# Patient Record
Sex: Female | Born: 1955 | Race: White | Hispanic: No | Marital: Single | Smoking: Current every day smoker
Health system: Southern US, Community
[De-identification: ages and names within clinical notes are randomized; demographics above are authoritative.]

## PROBLEM LIST (undated history)

## (undated) DIAGNOSIS — Z90710 Acquired absence of both cervix and uterus: Secondary | ICD-10-CM

## (undated) HISTORY — DX: Acquired absence of both cervix and uterus: Z90.710

## (undated) HISTORY — PX: TOTAL ABDOMINAL HYSTERECTOMY: SHX209

---

## 2005-03-04 ENCOUNTER — Emergency Department: Payer: Self-pay | Admitting: Emergency Medicine

## 2006-08-18 ENCOUNTER — Other Ambulatory Visit: Payer: Self-pay

## 2006-08-19 ENCOUNTER — Inpatient Hospital Stay: Payer: Self-pay | Admitting: Internal Medicine

## 2006-08-19 ENCOUNTER — Inpatient Hospital Stay: Payer: Self-pay | Admitting: Psychiatry

## 2008-10-29 ENCOUNTER — Emergency Department: Payer: Self-pay | Admitting: Emergency Medicine

## 2009-04-12 ENCOUNTER — Emergency Department (HOSPITAL_COMMUNITY): Admission: EM | Admit: 2009-04-12 | Discharge: 2009-04-12 | Payer: Self-pay | Admitting: Emergency Medicine

## 2009-12-25 ENCOUNTER — Emergency Department: Payer: Self-pay | Admitting: Emergency Medicine

## 2011-01-11 ENCOUNTER — Ambulatory Visit: Payer: Self-pay

## 2012-03-07 ENCOUNTER — Ambulatory Visit: Payer: Self-pay | Admitting: Family Medicine

## 2013-08-25 ENCOUNTER — Ambulatory Visit: Payer: Self-pay

## 2014-04-30 ENCOUNTER — Emergency Department: Payer: Self-pay | Admitting: Emergency Medicine

## 2014-04-30 LAB — URINALYSIS, COMPLETE
BACTERIA: NEGATIVE
Bilirubin,UR: NEGATIVE
Glucose,UR: NEGATIVE mg/dL (ref 0–75)
KETONE: NEGATIVE
Leukocyte Esterase: NEGATIVE
Nitrite: NEGATIVE
PROTEIN: NEGATIVE
Ph: 5 (ref 4.5–8.0)
Specific Gravity: 1.015 (ref 1.003–1.030)

## 2014-04-30 LAB — CBC
HCT: 43 % (ref 35.0–47.0)
HGB: 14.5 g/dL (ref 12.0–16.0)
MCH: 29.2 pg (ref 26.0–34.0)
MCHC: 33.6 g/dL (ref 32.0–36.0)
MCV: 87 fL (ref 80–100)
Platelet: 170 10*3/uL (ref 150–440)
RBC: 4.95 10*6/uL (ref 3.80–5.20)
RDW: 13.7 % (ref 11.5–14.5)
WBC: 7.5 10*3/uL (ref 3.6–11.0)

## 2014-04-30 LAB — COMPREHENSIVE METABOLIC PANEL
ALK PHOS: 97 U/L
AST: 25 U/L (ref 15–37)
Albumin: 3.6 g/dL (ref 3.4–5.0)
Anion Gap: 11 (ref 7–16)
BILIRUBIN TOTAL: 0.2 mg/dL (ref 0.2–1.0)
BUN: 19 mg/dL — AB (ref 7–18)
CO2: 17 mmol/L — AB (ref 21–32)
CREATININE: 0.93 mg/dL (ref 0.60–1.30)
Calcium, Total: 8.1 mg/dL — ABNORMAL LOW (ref 8.5–10.1)
Chloride: 109 mmol/L — ABNORMAL HIGH (ref 98–107)
EGFR (African American): >60
EGFR (Non-African Amer.): >60
GLUCOSE: 97 mg/dL (ref 65–99)
Osmolality: 276 (ref 275–301)
Potassium: 3.6 mmol/L (ref 3.5–5.1)
SGPT (ALT): 25 U/L (ref 12–78)
SODIUM: 137 mmol/L (ref 136–145)
TOTAL PROTEIN: 6.9 g/dL (ref 6.4–8.2)

## 2014-04-30 LAB — ETHANOL
ETHANOL %: 0.188 % — AB (ref 0.000–0.080)
Ethanol: 188 mg/dL

## 2014-04-30 LAB — ACETAMINOPHEN LEVEL

## 2014-04-30 LAB — SALICYLATE LEVEL: Salicylates, Serum: 3.6 mg/dL — ABNORMAL HIGH

## 2014-05-01 LAB — DRUG SCREEN, URINE
AMPHETAMINES, UR SCREEN: NEGATIVE (ref ?–1000)
Barbiturates, Ur Screen: NEGATIVE (ref ?–200)
Benzodiazepine, Ur Scrn: POSITIVE (ref ?–200)
COCAINE METABOLITE, UR ~~LOC~~: NEGATIVE (ref ?–300)
Cannabinoid 50 Ng, Ur ~~LOC~~: NEGATIVE (ref ?–50)
MDMA (ECSTASY) UR SCREEN: POSITIVE (ref ?–500)
Methadone, Ur Screen: NEGATIVE (ref ?–300)
OPIATE, UR SCREEN: POSITIVE (ref ?–300)
PHENCYCLIDINE (PCP) UR S: NEGATIVE (ref ?–25)
TRICYCLIC, UR SCREEN: NEGATIVE (ref ?–1000)

## 2014-05-01 LAB — VALPROIC ACID LEVEL

## 2015-03-06 NOTE — Consult Note (Signed)
Brief Consult Note: Diagnosis: Alcohol intoxicatiom. alcohol abuse.   Patient was seen by consultant.   Consult note dictated.   Recommend further assessment or treatment.   Comments: Ms. Siefken has remote h/o depresTeena Dunksion. She got drunk last night and made suicidal threats. She is not suicidal or homicidal.  PLAN: 1. The patient no longer meet criteria for IVC. I will terminate proceedings. Please discharge as appropriate.   2. No medications recommended.  3. She is not interested in substance abuse treatment.  Electronic Signatures: Kristine LineaPucilowska, Jolanta (MD)  (Signed 19-Jun-15 12:32)  Authored: Brief Consult Note   Last Updated: 19-Jun-15 12:32 by Kristine LineaPucilowska, Jolanta (MD)
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
## 2015-11-29 ENCOUNTER — Other Ambulatory Visit: Payer: Self-pay | Admitting: *Deleted

## 2015-11-29 ENCOUNTER — Ambulatory Visit: Payer: Self-pay | Attending: Oncology | Admitting: *Deleted

## 2015-11-29 ENCOUNTER — Encounter: Payer: Self-pay | Admitting: *Deleted

## 2015-11-29 ENCOUNTER — Ambulatory Visit
Admission: RE | Admit: 2015-11-29 | Discharge: 2015-11-29 | Disposition: A | Discharge status: Home / Self Care | Payer: Self-pay | Source: Ambulatory Visit | Attending: Oncology | Admitting: Oncology

## 2015-11-29 VITALS — BP 164/88 | HR 65 | Temp 97.9°F | Ht 64.17 in | Wt 149.9 lb

## 2015-11-29 DIAGNOSIS — Z0000 Encounter for general adult medical examination without abnormal findings: Secondary | ICD-10-CM

## 2015-11-29 NOTE — Progress Notes (Signed)
Subjective:     Patient ID: Garth Bignessorie L Hunsucker, female   DOB: 06/01/1956, 60 y.o.   MRN: [id number]  HPI   Review of Systems     Objective:   Physical Exam  Pulmonary/Chest: Right breast exhibits no inverted nipple, no mass, no nipple discharge, no skin change and no tenderness. Left breast exhibits no inverted nipple, no mass, no nipple discharge, no skin change and no tenderness. Breasts are symmetrical.       Assessment:     60 year old White female [facility]  Clinical breast exam unremarkable. Taught self breast awareness.  Patient mentioned she would have a "blister like" lesion come up and then it would turn into a "dry spot" on her skin.  There is a cm like dry area in the upper right chest area.  Encouraged a dermatology consult.  States she can [facility].  Blood pressure elevated at 164/88.  She is to recheck her blood pressure at [facility] or [facility], and if remains higher than 140/90 she is to follow-up with her primary care provider.  States her primary care provider has just readjusted her meds, and she also is seeing a hematologist this Friday in [place] for elevated Hgb.   Hand out on hypertention given to patient. Also encouraged smoking cessation. Patient has been screened for eligibility.  She does not have any insurance, Medicare or Medicaid.  She also meets financial eligibility.  Hand-out given on the Affordable Care Act.    Plan:     Screening mammogram ordered.  Will follow up per [facility] protocol.

## 2015-11-29 NOTE — Patient Instructions (Addendum)
Smoking Cessation, Tips for Success If you are ready to quit smoking, congratulations! You have chosen to help yourself be healthier. Cigarettes bring nicotine, tar, carbon monoxide, and other irritants into your body. Your lungs, heart, and blood vessels will be able to work better without these poisons. There are many different ways to quit smoking. Nicotine gum, nicotine patches, a nicotine inhaler, or nicotine nasal spray can help with physical craving. Hypnosis, support groups, and medicines help break the habit of smoking. WHAT THINGS CAN I DO TO MAKE QUITTING EASIER?  Here are some tips to help you quit for good: Pick a date when you will quit smoking completely. Tell all of your friends and family about your plan to quit on that date. Do not try to slowly cut down on the number of cigarettes you are smoking. Pick a quit date and quit smoking completely starting on that day. Throw away all cigarettes.  Clean and remove all ashtrays from your home, work, and car. On a card, write down your reasons for quitting. Carry the card with you and read it when you get the urge to smoke. Cleanse your body of nicotine. Drink enough water and fluids to keep your urine clear or pale yellow. Do this after quitting to flush the nicotine from your body. Learn to predict your moods. Do not let a bad situation be your excuse to have a cigarette. Some situations in your life might tempt you into wanting a cigarette. Never have "just one" cigarette. It leads to wanting another and another. Remind yourself of your decision to quit. Change habits associated with smoking. If you smoked while driving or when feeling stressed, try other activities to replace smoking. Stand up when drinking your coffee. Brush your teeth after eating. Sit in a different chair when you read the paper. Avoid alcohol while trying to quit, and try to drink fewer caffeinated beverages. Alcohol and caffeine may urge you to smoke. Avoid foods and  drinks that can trigger a desire to smoke, such as sugary or spicy foods and alcohol. Ask people who smoke not to smoke around you. Have something planned to do right after eating or having a cup of coffee. For example, plan to take a walk or exercise. Try a relaxation exercise to calm you down and decrease your stress. Remember, you may be tense and nervous for the first 2 weeks after you quit, but this will pass. Find new activities to keep your hands busy. Play with a pen, coin, or rubber band. Doodle or draw things on paper. Brush your teeth right after eating. This will help cut down on the craving for the taste of tobacco after meals. You can also try mouthwash.  Use oral substitutes in place of cigarettes. Try using lemon drops, carrots, cinnamon sticks, or chewing gum. Keep them handy so they are available when you have the urge to smoke. When you have the urge to smoke, try deep breathing. Designate your home as a nonsmoking area. If you are a heavy smoker, ask your health care provider about a prescription for nicotine chewing gum. It can ease your withdrawal from nicotine. Reward yourself. Set aside the cigarette money you save and buy yourself something nice. Look for support from others. Join a support group or smoking cessation program. Ask someone at home or at work to help you with your plan to quit smoking. Always ask yourself, "Do I need this cigarette or is this just a reflex?" Tell yourself, "Today, I  choose not to smoke," or "I do not want to smoke." You are reminding yourself of your decision to quit. Do not replace cigarette smoking with electronic cigarettes (commonly called e-cigarettes). The safety of e-cigarettes is unknown, and some may contain harmful chemicals. If you relapse, do not give up! Plan ahead and think about what you will do the next time you get the urge to smoke. HOW WILL I FEEL WHEN I QUIT SMOKING? You may have symptoms of withdrawal because your body is  used to nicotine (the addictive substance in cigarettes). You may crave cigarettes, be irritable, feel very hungry, cough often, get headaches, or have difficulty concentrating. The withdrawal symptoms are only temporary. They are strongest when you first quit but will go away within 10-14 days. When withdrawal symptoms occur, stay in control. Think about your reasons for quitting. Remind yourself that these are signs that your body is healing and getting used to being without cigarettes. Remember that withdrawal symptoms are easier to treat than the major diseases that smoking can cause.  Even after the withdrawal is over, expect periodic urges to smoke. However, these cravings are generally short lived and will go away whether you smoke or not. Do not smoke! WHAT RESOURCES ARE AVAILABLE TO HELP ME QUIT SMOKING? Your health care provider can direct you to community resources or hospitals for support, which may include: Group support. Education. Hypnosis. Therapy.   This information is not intended to replace advice given to you by your health care provider. Make sure you discuss any questions you have with your health care provider.   Document Released: 07/28/2004 Document Revised: 11/20/2014 Document Reviewed: 04/17/2013 Elsevier Interactive Patient Education 2016 [employer]. Hypertension Hypertension, commonly called high blood pressure, is when the force of blood pumping through your arteries is too strong. Your arteries are the blood vessels that carry blood from your heart throughout your body. A blood pressure reading consists of a higher number over a lower number, such as 110/72. The higher number (systolic) is the pressure inside your arteries when your heart pumps. The lower number (diastolic) is the pressure inside your arteries when your heart relaxes. Ideally you want your blood pressure below 120/80. Hypertension forces your heart to work harder to pump blood. Your arteries may  become narrow or stiff. Having untreated or uncontrolled hypertension can cause heart attack, stroke, kidney disease, and other problems. RISK FACTORS Some risk factors for high blood pressure are controllable. Others are not.  Risk factors you cannot control include:   Race. You may be at higher risk if you are African American.  Age. Risk increases with age.  Gender. Men are at higher risk than women before age 71 years. After age 80, women are at higher risk than men. Risk factors you can control include:  Not getting enough exercise or physical activity.  Being overweight.  Getting too much fat, sugar, calories, or salt in your diet.  Drinking too much alcohol. SIGNS AND SYMPTOMS Hypertension does not usually cause signs or symptoms. Extremely high blood pressure (hypertensive crisis) may cause headache, anxiety, shortness of breath, and nosebleed. DIAGNOSIS To check if you have hypertension, your health care provider will measure your blood pressure while you are seated, with your arm held at the level of your heart. It should be measured at least twice using the same arm. Certain conditions can cause a difference in blood pressure between your right and left arms. A blood pressure reading that is higher than normal on  one occasion does not mean that you need treatment. If it is not clear whether you have high blood pressure, you may be asked to return on a different day to have your blood pressure checked again. Or, you may be asked to monitor your blood pressure at home for 1 or more weeks. TREATMENT Treating high blood pressure includes making lifestyle changes and possibly taking medicine. Living a healthy lifestyle can help lower high blood pressure. You may need to change some of your habits. Lifestyle changes may include:  Following the DASH diet. This diet is high in fruits, vegetables, and whole grains. It is low in salt, red meat, and added sugars.  Keep your sodium intake  below 2,300 mg per day.  Getting at least 30-45 minutes of aerobic exercise at least 4 times per week.  Losing weight if necessary.  Not smoking.  Limiting alcoholic beverages.  Learning ways to reduce stress. Your health care provider may prescribe medicine if lifestyle changes are not enough to get your blood pressure under control, and if one of the following is true:  You are 1118-58 years of age and your systolic blood pressure is above 140.  You are 58 years of age or older, and your systolic blood pressure is above 150.  Your diastolic blood pressure is above 90.  You have diabetes, and your systolic blood pressure is over 140 or your diastolic blood pressure is over 90.  You have kidney disease and your blood pressure is above 140/90.  You have heart disease and your blood pressure is above 140/90. Your personal target blood pressure may vary depending on your medical conditions, your age, and other factors. HOME CARE INSTRUCTIONS  Have your blood pressure rechecked as directed by your health care provider.   Take medicines only as directed by your health care provider. Follow the directions carefully. Blood pressure medicines must be taken as prescribed. The medicine does not work as well when you skip doses. Skipping doses also puts you at risk for problems.  Do not smoke.   Monitor your blood pressure at home as directed by your health care provider. SEEK MEDICAL CARE IF:   You think you are having a reaction to medicines taken.  You have recurrent headaches or feel dizzy.  You have swelling in your ankles.  You have trouble with your vision. SEEK IMMEDIATE MEDICAL CARE IF:  You develop a severe headache or confusion.  You have unusual weakness, numbness, or feel faint.  You have severe chest or abdominal pain.  You vomit repeatedly.  You have trouble breathing. MAKE SURE YOU:   Understand these instructions.  Will watch your condition.  Will  get help right away if you are not doing well or get worse.   This information is not intended to replace advice given to you by your health care provider. Make sure you discuss any questions you have with your health care provider.   Document Released: 10/30/2005 Document Revised: 03/16/2015 Document Reviewed: 08/22/2013 Elsevier Interactive Patient Education 2016 [employer]   Gave patient hand-out, Women Staying Healthy, Active and Well from [facility], with education on breast health, pap smears, heart and colon health.

## 2015-11-29 NOTE — Progress Notes (Signed)
Letter mailed from [facility] to inform patient of her normal mammogram results.  Patient is to follow-up with annual screening in one year.  HSIS to Christy. 

## 2015-12-01 DIAGNOSIS — D61818 Other pancytopenia: Secondary | ICD-10-CM | POA: Insufficient documentation

## 2019-06-09 ENCOUNTER — Encounter: Payer: Self-pay | Admitting: Emergency Medicine

## 2019-06-09 ENCOUNTER — Emergency Department
Admission: EM | Admit: 2019-06-09 | Discharge: 2019-06-09 | Disposition: A | Discharge status: Home / Self Care | Payer: Self-pay | Attending: Emergency Medicine | Admitting: Emergency Medicine

## 2019-06-09 ENCOUNTER — Other Ambulatory Visit: Payer: Self-pay

## 2019-06-09 DIAGNOSIS — L03116 Cellulitis of left lower limb: Secondary | ICD-10-CM | POA: Insufficient documentation

## 2019-06-09 DIAGNOSIS — Y929 Unspecified place or not applicable: Secondary | ICD-10-CM | POA: Insufficient documentation

## 2019-06-09 DIAGNOSIS — F17200 Nicotine dependence, unspecified, uncomplicated: Secondary | ICD-10-CM | POA: Insufficient documentation

## 2019-06-09 DIAGNOSIS — L089 Local infection of the skin and subcutaneous tissue, unspecified: Secondary | ICD-10-CM | POA: Insufficient documentation

## 2019-06-09 DIAGNOSIS — B373 Candidiasis of vulva and vagina: Secondary | ICD-10-CM | POA: Insufficient documentation

## 2019-06-09 DIAGNOSIS — Y939 Activity, unspecified: Secondary | ICD-10-CM | POA: Insufficient documentation

## 2019-06-09 DIAGNOSIS — T63441A Toxic effect of venom of bees, accidental (unintentional), initial encounter: Secondary | ICD-10-CM | POA: Insufficient documentation

## 2019-06-09 DIAGNOSIS — X58XXXA Exposure to other specified factors, initial encounter: Secondary | ICD-10-CM | POA: Insufficient documentation

## 2019-06-09 DIAGNOSIS — S90829A Blister (nonthermal), unspecified foot, initial encounter: Secondary | ICD-10-CM | POA: Insufficient documentation

## 2019-06-09 DIAGNOSIS — Y999 Unspecified external cause status: Secondary | ICD-10-CM | POA: Insufficient documentation

## 2019-06-09 MED ORDER — TRAMADOL HCL 50 MG PO TABS
50.0000 mg | ORAL_TABLET | Freq: Once | ORAL | Status: AC
  Administered 2019-06-09: 22:00:00 50 mg via ORAL
  Filled 2019-06-09: qty 1

## 2019-06-09 MED ORDER — CEPHALEXIN 500 MG PO CAPS
500.0000 mg | ORAL_CAPSULE | Freq: Once | ORAL | Status: AC
  Administered 2019-06-09: 21:00:00 500 mg via ORAL
  Filled 2019-06-09: qty 1

## 2019-06-09 MED ORDER — FLUCONAZOLE 150 MG PO TABS: 150.0000 mg | ORAL_TABLET | Freq: Once | ORAL | 0 refills | Status: AC

## 2019-06-09 MED ORDER — MUPIROCIN CALCIUM 2 % EX CREA
TOPICAL_CREAM | Freq: Once | CUTANEOUS | Status: AC
  Administered 2019-06-09: 1 via TOPICAL
  Filled 2019-06-09: qty 15

## 2019-06-09 MED ORDER — ACETAMINOPHEN-CODEINE #3 300-30 MG PO TABS: 1.0000 | ORAL_TABLET | Freq: Three times a day (TID) | ORAL | 0 refills | Status: AC | PRN

## 2019-06-09 MED ORDER — CEPHALEXIN 500 MG PO CAPS: 500.0000 mg | ORAL_CAPSULE | Freq: Three times a day (TID) | ORAL | 0 refills | Status: AC

## 2019-06-09 MED ORDER — MUPIROCIN 2 % EX OINT: TOPICAL_OINTMENT | CUTANEOUS | 0 refills | Status: AC

## 2019-06-09 NOTE — ED Triage Notes (Signed)
Pt reports she was bite by yellow jacket on left ankle on Friday and wore tennis shoes over the weekend. Today pt has had multiple blisters around area of sting as well as swelling and clear drainage. Area is swollen, bruised and clear drainage noted. Pt denies fever.

## 2019-06-09 NOTE — Discharge Instructions (Addendum)
Keep the wound clean, dry, and covered. Change dressings daily. Rest with the foot elevated when seated.

## 2019-06-10 NOTE — ED Provider Notes (Signed)
[facility] Emergency Department Provider Note ____________________________________________  Time seen: 2032  I have reviewed the triage vital signs and the nursing notes.  HISTORY  Chief Complaint  Blister  HPI Krista Spencer is a 63 y.o. female presents herself to the ED for evaluation of a foot wound and blisters to the left heel.  Patient admits to being stung on the ankle by a yellow jacket on Friday.  She reports that over the weekend, she wore tennis shoes and spent and a large amount of time on her feet helping her son to move.  By the time she looked at her foot, she has significant swelling and edema to the foot and ankle as well as blister formation to the Achilles and heel region.  She presents today for further evaluation of her symptoms patient denies any interim fevers, chills, or sweats.  She does admit to busting the initial large intact blister, and noticing some clear drainage from the area.  History reviewed. No pertinent past medical history.  There are no active problems to display for this patient.  History reviewed. No pertinent surgical history.  Prior to Admission medications   Medication Sig Start Date End Date Taking? Authorizing Provider  acetaminophen-codeine (TYLENOL #3) 300-30 MG tablet Take 1 tablet by mouth 3 (three) times daily as needed for up to 3 days for moderate pain. 06/09/19 06/12/19  Elleen Coulibaly, Dannielle Karvonen, PA-C  cephALEXin (KEFLEX) 500 MG capsule Take 1 capsule (500 mg total) by mouth 3 (three) times daily. 06/09/19   Vint Pola, Dannielle Karvonen, PA-C  mupirocin ointment (BACTROBAN) 2 % Apply to affected area 2 times daily 06/09/19   Iolani Twilley, Dannielle Karvonen, PA-C    Allergies Patient has no known allergies.  History reviewed. No pertinent family history.  Social History Social History   Tobacco Use  . Smoking status: Current Every Day Smoker  . Smokeless tobacco: Never Used  Substance Use Topics  . Alcohol use: Not on  file  . Drug use: Not on file    Review of Systems  Constitutional: Negative for fever. Eyes: Negative for visual changes. ENT: Negative for sore throat. Cardiovascular: Negative for chest pain. Respiratory: Negative for shortness of breath. Gastrointestinal: Negative for abdominal pain, vomiting and diarrhea. Genitourinary: Negative for dysuria. Musculoskeletal: Negative for back pain.  Left leg swelling as above. Skin: Negative for rash.  Left foot blisters as above. Neurological: Negative for headaches, focal weakness or numbness. ____________________________________________  PHYSICAL EXAM:  VITAL SIGNS: ED Triage Vitals  Enc Vitals Group     BP 06/09/19 1914 (!) 159/83     Pulse Rate 06/09/19 1914 92     Resp 06/09/19 1914 17     Temp 06/09/19 1914 98.9 F (37.2 C)     Temp Source 06/09/19 1914 Oral     SpO2 06/09/19 1914 98 %     Weight --      Height --      Head Circumference --      Peak Flow --      Pain Score 06/09/19 2155 7     Pain Loc --      Pain Edu? --      Excl. in [place]? --     Constitutional: Alert and oriented. Well appearing and in no distress. Head: Normocephalic and atraumatic. Eyes: Conjunctivae are normal. Normal extraocular movements Cardiovascular: Normal rate, regular rhythm. Normal distal pulses.  Left lower extremity edema from the mid shin to the dorsal  foot. Respiratory: Normal respiratory effort.  Musculoskeletal: Nontender with normal range of motion in all extremities.  Neurologic:  Normal gait without ataxia. Normal speech and language. No gross focal neurologic deficits are appreciated. Skin:  Skin is warm, dry and intact. No rash noted.  Patient with intact blisters to the dorsal and lateral aspect of the foot and ankle.  Patient with a large blister bed with focal ecchymosis and some honey colored crust noted.  There is also an area of ecchymosis consistent with a bee sting.  No lymphangitis, induration, or abscess formation is  appreciated. ____________________________________________  PROCEDURES  Procedures Keflex 500 mg PO Ultram 50 mg PO Mupirocin cream Dressing ____________________________________________  INITIAL IMPRESSION / ASSESSMENT AND PLAN / ED COURSE  Krista Spencer was evaluated in Emergency Department on 06/10/2019 for the symptoms described in the history of present illness. She was evaluated in the context of the global COVID-19 pandemic, which necessitated consideration that the patient might be at risk for infection with the SARS-CoV-2 virus that causes COVID-19. Institutional protocols and algorithms that pertain to the evaluation of patients at risk for COVID-19 are in a state of rapid change based on information released by regulatory bodies including the CDC and federal and state organizations. These policies and algorithms were followed during the patient's care in the ED.  Patient with ED evaluation of a local reaction to a bee sting, as well as local blister formation after she continued to be on her feet following the initial sting.  Patient without any signs of a focal abscess.  Symptoms likely represent a secondary staph infection after she opened an intact friction blister to the foot and ankle.  Patient will be treated empirically with Keflex and topical mupirocin ointment.  She is also given a Diflucan for yeast vaginitis.  A small prescription of Tylenol #3 will provided for pain relief.  She is required to rest with the foot elevated and change the dressing daily as needed.  She is to keep the remaining blisters intact, and cleanse the area daily with mild soap and water.  Wound care instructions and supplies are provided.  Patient will follow-up with a primary provider or return to the ED for acutely worsening symptoms as discussed.  I reviewed the patient's prescription history over the last 12 months in the multi-state controlled substances database(s) that includes [place], [place], [place], [place], [place], [place], [place], [place], [place], [place], [place], [place], and [place].  Results were notable for no current prescriptions.  ____________________________________________  FINAL CLINICAL IMPRESSION(S) / ED DIAGNOSES  Final diagnoses:  Cellulitis of left lower extremity  Blister of foot with infection, initial encounter  Bee sting reaction, accidental or unintentional, initial encounter      Lissa HoardMenshew, Anival Pasha V Bacon, PA-C 06/10/19 2330    Phineas SemenGoodman, [place], MD 06/11/19 1714

## 2022-03-20 ENCOUNTER — Other Ambulatory Visit: Payer: Self-pay

## 2022-03-20 DIAGNOSIS — N6310 Unspecified lump in the right breast, unspecified quadrant: Secondary | ICD-10-CM

## 2022-03-20 DIAGNOSIS — Z1211 Encounter for screening for malignant neoplasm of colon: Secondary | ICD-10-CM

## 2022-03-21 ENCOUNTER — Other Ambulatory Visit: Payer: Self-pay

## 2022-03-21 ENCOUNTER — Ambulatory Visit: Payer: Self-pay | Attending: Hematology and Oncology | Admitting: *Deleted

## 2022-03-21 ENCOUNTER — Encounter: Payer: Self-pay | Admitting: *Deleted

## 2022-03-21 ENCOUNTER — Encounter (INDEPENDENT_AMBULATORY_CARE_PROVIDER_SITE_OTHER): Payer: Self-pay

## 2022-03-21 ENCOUNTER — Ambulatory Visit
Admission: RE | Admit: 2022-03-21 | Discharge: 2022-03-21 | Disposition: A | Discharge status: Home / Self Care | Payer: Self-pay | Source: Ambulatory Visit | Attending: Obstetrics and Gynecology | Admitting: Obstetrics and Gynecology

## 2022-03-21 VITALS — BP 127/88 | HR 79 | Resp 18 | Wt 132.0 lb

## 2022-03-21 DIAGNOSIS — N6310 Unspecified lump in the right breast, unspecified quadrant: Secondary | ICD-10-CM | POA: Insufficient documentation

## 2022-03-21 DIAGNOSIS — N6312 Unspecified lump in the right breast, upper inner quadrant: Secondary | ICD-10-CM

## 2022-03-21 NOTE — Progress Notes (Deleted)
?  Subjective:  ?  ? Patient ID: Krista Spencer, female   DOB: 05-15-1956, 66 y.o.   MRN: [id number] ? ?HPI ? ? ?Review of Systems ? ?   ?Objective:  ? Physical Exam ?Chest:  ?Breasts: ?   Right: Mass and tenderness present. No swelling, bleeding, inverted nipple, nipple discharge or skin change.  ?   Left: No swelling, bleeding, inverted nipple, mass, nipple discharge, skin change or tenderness.  ? ? ?Lymphadenopathy:  ?   Upper Body:  ?   Right upper body: No supraclavicular adenopathy.  ?   Left upper body: No supraclavicular adenopathy.  ? ?   ?Assessment:  ?   ?*** ?   ?Plan:  ?   ?*** ?   ?

## 2022-03-21 NOTE — Progress Notes (Addendum)
[name] is a 66 y.o. No obstetric history on file. female who presents to [facility] clinic today with complaint of a right breast mass. Patient states she found the area of concern about 6 weeks ago.  States it was tender at the site and she felt at lump.   ?  ?Pap Smear: Pap smear not completed today. Last Pap smear was on 12/11/11 at [facility] clinic and was normal. Per patient has no history of an abnormal Pap smear. Last Pap smear result is available in Epic.  Last pap in 2013 was negative / negative. ?  ?Physical exam: Physical Exam ?Chest:  ?Breasts: ?   Right: Mass and tenderness present. No swelling, bleeding, inverted nipple, nipple discharge or skin change.  ?   Left: No swelling, bleeding, inverted nipple, mass, nipple discharge, skin change or tenderness.  ?  ?Lymphadenopathy:  ?   Upper Body:  ?   Right upper body: No supraclavicular adenopathy.  ?   Left upper body: No supraclavicular adenopathy.  ?  ?  ?  ?Smoking History: ?Patient has is a current smoker at unknown packs per day Patient referred to [facility] Smoking Cessation information.  ?  ?Patient Navigation: ?Patient education provided. Access to services provided for patient through [facility] program. No interpreter provided. No transportation provided  ? ?Colorectal Cancer Screening: ?Per patient has never had colonoscopy completed.  Fit test given today.   No complaints today.  ?  ?Breast and Cervical Cancer Risk Assessment: ?Patient has family history of cancer, no known genetic mutations, or radiation treatment to the chest before age 76,  immunocompromised, or DES exposure in-utero. Patient has history of abnormal pap and colposcopies per patient. ? ?Risk Assessment   ?No risk assessment data ?  ? ?Risk Assessment   ? ? Risk Scores   ? ?   03/21/2022  ? Last edited by: Drue Dun, RN  ? 5-year risk: 1.3 %  ? Lifetime risk: 5.5 %  ? ?  ?  ? ?  ?  ? ?A: ?BCCCP exam without pap smear ?Complaint of breast mass ? ?P: ?[facility] mamm[facility]. Appointment scheduled today. ? ?Rico Junker, RN ?03/21/2022 12:40 PM   ?

## 2022-03-22 ENCOUNTER — Other Ambulatory Visit: Payer: Self-pay | Admitting: Obstetrics and Gynecology

## 2022-03-22 DIAGNOSIS — N63 Unspecified lump in unspecified breast: Secondary | ICD-10-CM

## 2022-03-22 DIAGNOSIS — R928 Other abnormal and inconclusive findings on diagnostic imaging of breast: Secondary | ICD-10-CM

## 2022-04-05 ENCOUNTER — Ambulatory Visit
Admission: RE | Admit: 2022-04-05 | Discharge: 2022-04-05 | Disposition: A | Discharge status: Home / Self Care | Payer: Self-pay | Source: Ambulatory Visit | Attending: Obstetrics and Gynecology | Admitting: Obstetrics and Gynecology

## 2022-04-05 DIAGNOSIS — N63 Unspecified lump in unspecified breast: Secondary | ICD-10-CM | POA: Insufficient documentation

## 2022-04-05 DIAGNOSIS — R928 Other abnormal and inconclusive findings on diagnostic imaging of breast: Secondary | ICD-10-CM

## 2022-04-05 HISTORY — PX: BREAST BIOPSY: SHX20

## 2022-04-06 LAB — SURGICAL PATHOLOGY

## 2022-12-19 IMAGING — MG MM CLIP PLACEMENT RIGHT
4 series · 4 of 12 positions shown · non-contrast
Comparison: Previous exam(s).

CLINICAL DATA: Status post ultrasound-guided biopsy.

EXAM:
DIAGNOSTIC RIGHT MAMMOGRAM POST ULTRASOUND BIOPSY

[R ML synth-2D]
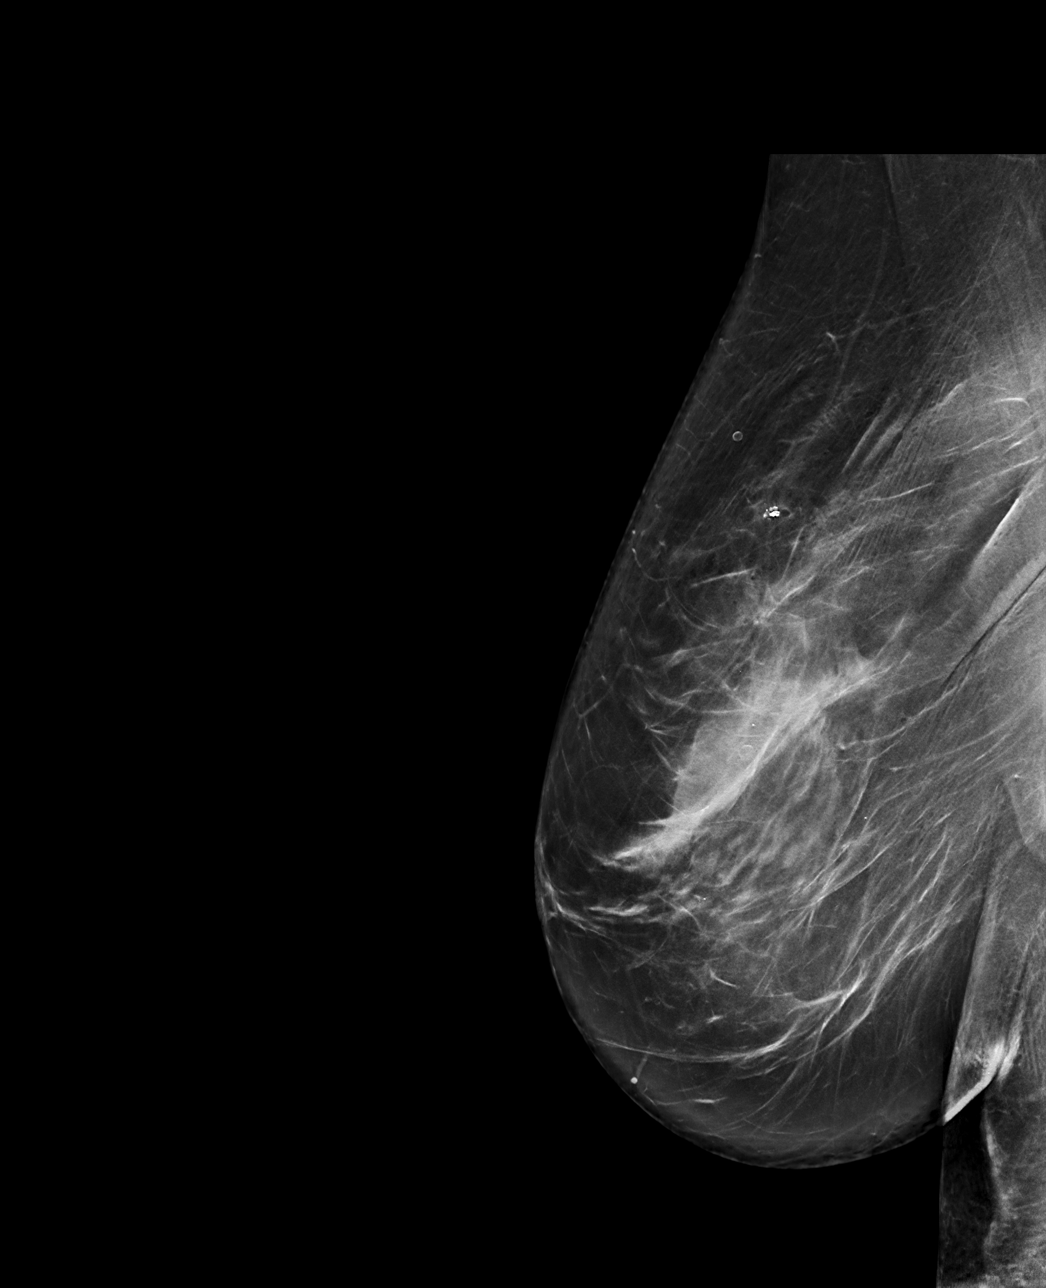

[R CC synth-2D]
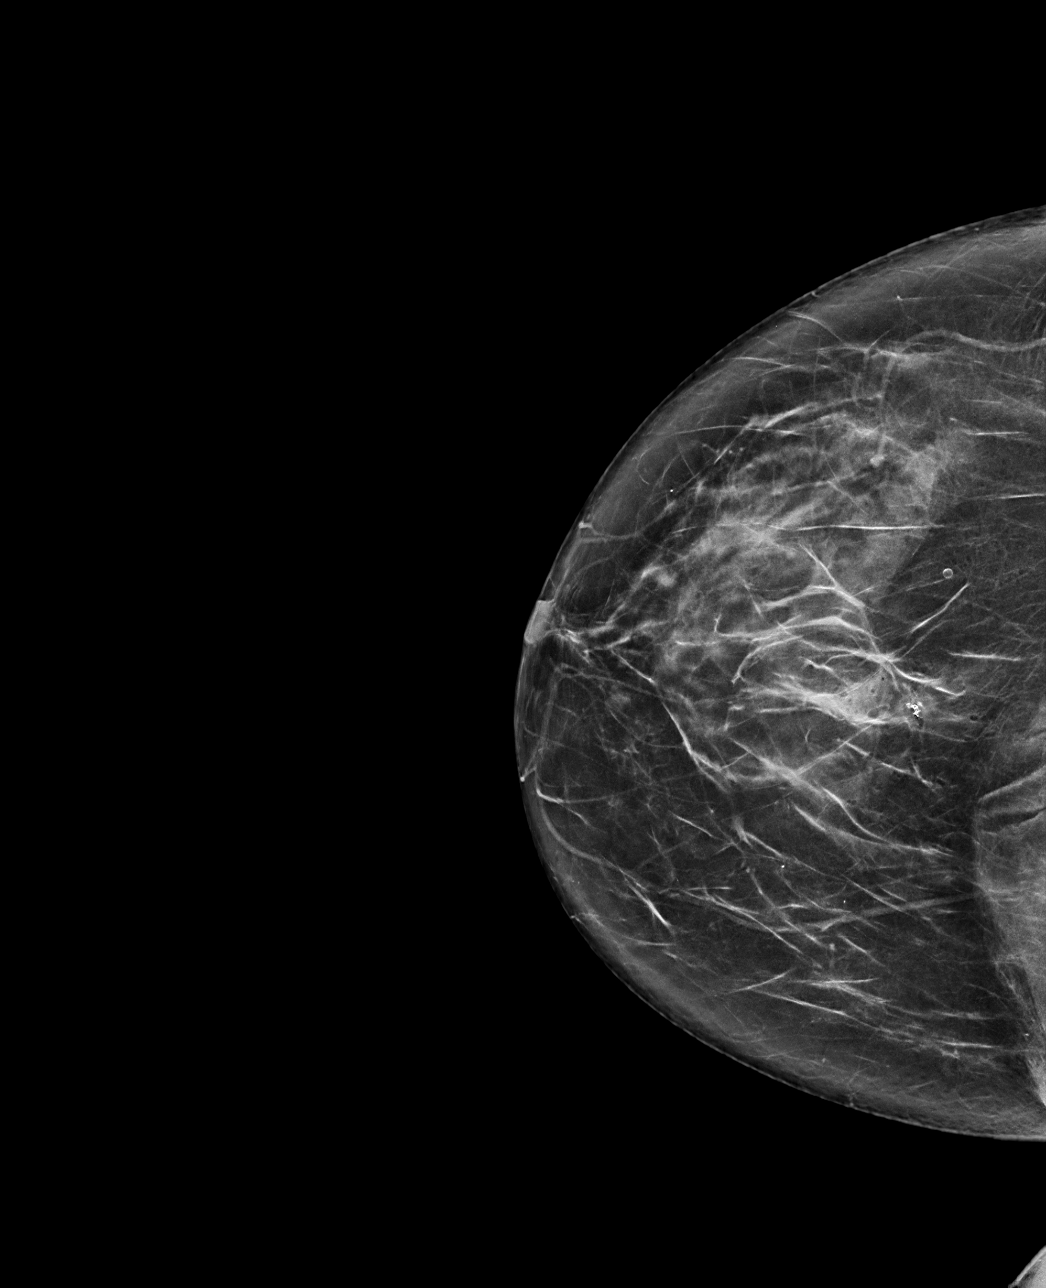

[R ML tomo · tomo slice 44/87.0]
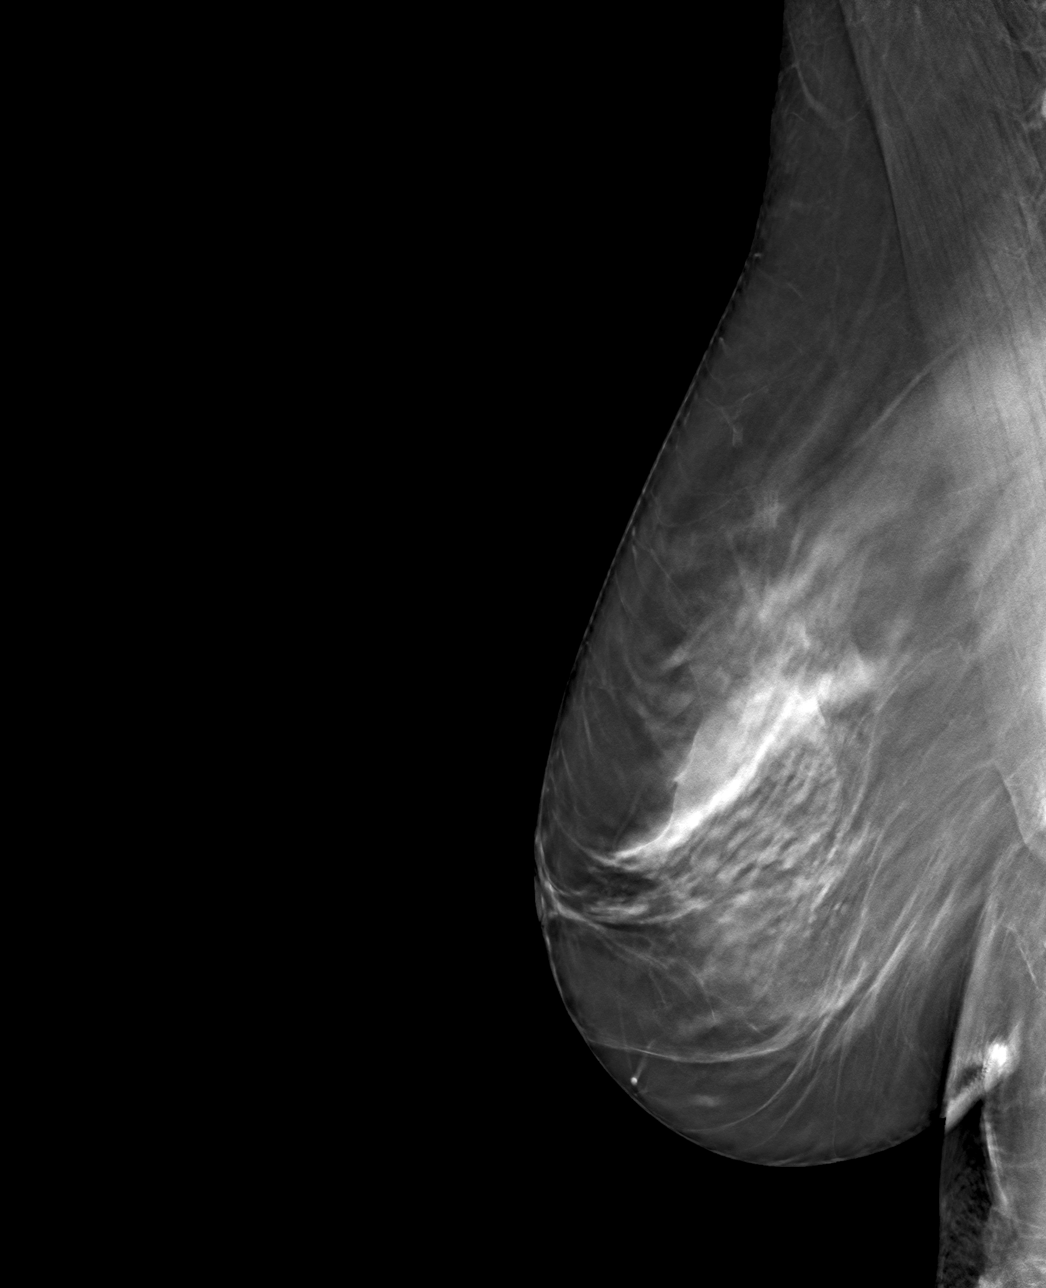

[R CC tomo · tomo slice 35/70.0]
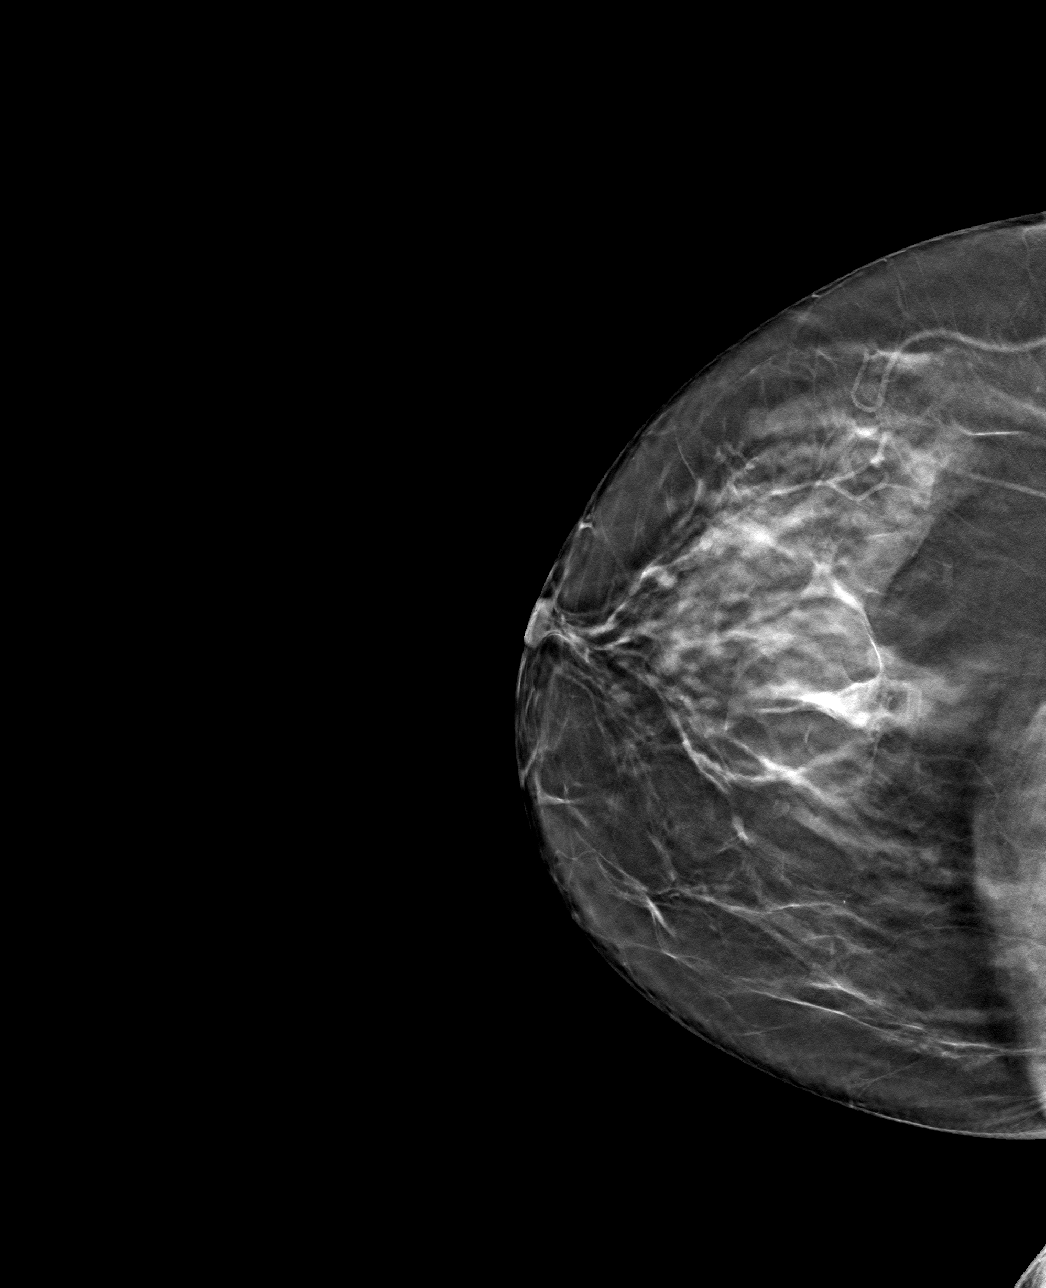

[4 of 12 positions shown; findings below may reference images not displayed]

FINDINGS: Mammographic images were obtained following ultrasound guided biopsy
of mass at [DATE]. The HHAG biopsy marking clip is in expected
position at the site of biopsy.
IMPRESSION: Appropriate positioning of the HHAG shaped biopsy marking clip at
the site of biopsy in the upper breast.

Final Assessment: Post Procedure Mammograms for Marker Placement

## 2023-12-28 ENCOUNTER — Other Ambulatory Visit: Payer: Self-pay | Admitting: Family Medicine

## 2023-12-28 DIAGNOSIS — Z780 Asymptomatic menopausal state: Secondary | ICD-10-CM

## 2023-12-31 ENCOUNTER — Encounter (INDEPENDENT_AMBULATORY_CARE_PROVIDER_SITE_OTHER): Payer: 59

## 2023-12-31 ENCOUNTER — Encounter (INDEPENDENT_AMBULATORY_CARE_PROVIDER_SITE_OTHER): Payer: Self-pay | Admitting: Vascular Surgery

## 2023-12-31 ENCOUNTER — Other Ambulatory Visit: Payer: Self-pay | Admitting: Family Medicine

## 2023-12-31 DIAGNOSIS — Z1231 Encounter for screening mammogram for malignant neoplasm of breast: Secondary | ICD-10-CM

## 2024-01-10 ENCOUNTER — Other Ambulatory Visit (INDEPENDENT_AMBULATORY_CARE_PROVIDER_SITE_OTHER): Payer: Self-pay | Admitting: Nurse Practitioner

## 2024-01-10 DIAGNOSIS — I998 Other disorder of circulatory system: Secondary | ICD-10-CM

## 2024-01-10 DIAGNOSIS — I739 Peripheral vascular disease, unspecified: Secondary | ICD-10-CM

## 2024-01-11 ENCOUNTER — Encounter (INDEPENDENT_AMBULATORY_CARE_PROVIDER_SITE_OTHER): Payer: Self-pay | Admitting: Nurse Practitioner

## 2024-01-11 ENCOUNTER — Ambulatory Visit (INDEPENDENT_AMBULATORY_CARE_PROVIDER_SITE_OTHER): Payer: 59

## 2024-01-11 ENCOUNTER — Ambulatory Visit (INDEPENDENT_AMBULATORY_CARE_PROVIDER_SITE_OTHER): Payer: 59 | Admitting: Nurse Practitioner

## 2024-01-11 VITALS — BP 173/101 | HR 73 | Resp 18 | Ht 63.5 in | Wt 132.5 lb

## 2024-01-11 DIAGNOSIS — R6889 Other general symptoms and signs: Secondary | ICD-10-CM

## 2024-01-11 DIAGNOSIS — G43909 Migraine, unspecified, not intractable, without status migrainosus: Secondary | ICD-10-CM

## 2024-01-11 DIAGNOSIS — R42 Dizziness and giddiness: Secondary | ICD-10-CM

## 2024-01-11 DIAGNOSIS — I739 Peripheral vascular disease, unspecified: Secondary | ICD-10-CM | POA: Diagnosis not present

## 2024-01-11 DIAGNOSIS — I998 Other disorder of circulatory system: Secondary | ICD-10-CM | POA: Diagnosis not present

## 2024-01-13 NOTE — Progress Notes (Signed)
 Subjective:    Patient ID: Krista Spencer, female    DOB: 03-26-1956, 68 y.o.   MRN: [id number] Chief Complaint  Patient presents with   New Patient (Initial Visit)    np. ABI + consult. Other disorder of circulatory system + PAD. Ref: Shane Crutch    The patient is a 68 year old female who presents today for evaluation of having abnormal ABIs done during a home health screening.  Today the patient does have a number of issues that are concerning for her.  She notes that she has tingling that comes and goes in her left lower extremity.  She also notes that she had an episode this morning of lightheadedness that was concerning for her.  This lightheaded episode was associated with chest pain that also ran down her left arm.  She notes that she did not call 911 because her home was somewhat out of place and she did not wish to call them at that time.  She notes that she has a history of ocular migraines and feels that she no longer has ocular migraines but does endorse having headaches enough to where she has to take multiple B6 Goody powders daily.  She denies symptoms that are consistent with claudication.  She has no rest pain or open wounds or ulcerations.  Today the patient had an ABI of 1.11 on the right and 1.07 on the left.  She has triphasic tibial artery waveforms bilaterally with normal toe waveforms bilaterally.    Review of Systems  Cardiovascular:  Positive for chest pain. Negative for leg swelling.  Neurological:  Positive for light-headedness and headaches.  All other systems reviewed and are negative.      Objective:   Physical Exam Vitals reviewed.  HENT:     Head: Normocephalic.  Cardiovascular:     Rate and Rhythm: Normal rate.     Pulses: Normal pulses.  Pulmonary:     Effort: Pulmonary effort is normal.  Musculoskeletal:     Right lower leg: No edema.     Left lower leg: No edema.  Skin:    General: Skin is warm and dry.  Neurological:     Mental Status: She  is alert and oriented to person, place, and time.  Psychiatric:        Mood and Affect: Mood normal.        Behavior: Behavior normal.        Thought Content: Thought content normal.        Judgment: Judgment normal.     BP (!) 173/101   Pulse 73   Resp 18   Ht 5' 3.5" (1.613 m)   Wt 132 lb 8 oz (60.1 kg)   BMI 23.10 kg/m   Past Medical History:  Diagnosis Date   H/O total hysterectomy     Social History   Socioeconomic History   Marital status: Single    Spouse name: Not on file   Number of children: Not on file   Years of education: Not on file   Highest education level: Not on file  Occupational History   Not on file  Tobacco Use   Smoking status: Every Day   Smokeless tobacco: Never  Vaping Use   Vaping status: Never Used  Substance and Sexual Activity   Alcohol use: Not on file   Drug use: Not on file   Sexual activity: Not on file  Other Topics Concern   Not on file  Social History Narrative  Not on file   Social Drivers of Health   Financial Resource Strain: Not on file  Food Insecurity: Not on file  Transportation Needs: Not on file  Physical Activity: Not on file  Stress: Not on file  Social Connections: Not on file  Intimate Partner Violence: Not on file    Past Surgical History:  Procedure Laterality Date   BREAST BIOPSY Right 04/05/2022   [place] bx, venus marker, path pending   TOTAL ABDOMINAL HYSTERECTOMY     at age 25    Family History  Problem Relation Age of Onset   Breast cancer Neg Hx     Allergies  Allergen Reactions   Gabapentin Other (See Comments)    Stated it made her feel crazy   Sulfa Antibiotics     Other reaction(s): UNSPECIFIED   Venlafaxine Other (See Comments)    other   Sumatriptan     Other reaction(s): OTHER   Zolpidem     Other reaction(s): OTHER       Latest Ref Rng & Units 04/30/2014   10:52 PM  CBC  WBC 3.6 - 11.0 x10 3/mm 3 7.5   Hemoglobin 12.0 - 16.0 g/dL 82.9   Hematocrit 56.2 - 47.0 % 43.0    Platelets 150 - 440 x10 3/mm 3 170       CMP     Component Value Date/Time   NA 137 04/30/2014 2252   K 3.6 04/30/2014 2252   CL 109 (H) 04/30/2014 2252   CO2 17 (L) 04/30/2014 2252   GLUCOSE 97 04/30/2014 2252   BUN 19 (H) 04/30/2014 2252   CREATININE 0.93 04/30/2014 2252   CALCIUM 8.1 (L) 04/30/2014 2252   PROT 6.9 04/30/2014 2252   ALBUMIN 3.6 04/30/2014 2252   AST 25 04/30/2014 2252   ALT 25 04/30/2014 2252   ALKPHOS 97 04/30/2014 2252   BILITOT 0.2 04/30/2014 2252   GFRNONAA >60 04/30/2014 2252     No results found.     Assessment & Plan:   1. Abnormal ankle brachial index (ABI) (Primary) The patient had abnormal ABIs at a home health visit.  Today her ABIs are normal with no significance of arterial disease.  Patient will follow-up with [place] on an as-needed basis.  2. Migraine without status migrainosus, not intractable, unspecified migraine type The patient initially noted that her migraines had resolved but based on her description it sounds that she may have the suffering a different type of migraine or at the very least a debilitating headache.  Following discussion the patient is willing to go to neurology for further evaluation. - Ambulatory referral to Neurology    3. Lightheaded The patient had an episode of lightheadedness and pain this morning.  She was advised that it would be beneficial for her to visit the emergency room given her hypertension and her concerning chest pain symptoms.  We discussed that we are unable to evaluate here because we do not have things such as an EKG to evaluate for possible heart attack.  At this time the patient elects not to move forward with emergency room evaluation.   Current Outpatient Medications on File Prior to Visit  Medication Sig Dispense Refill   cyclobenzaprine (FLEXERIL) 10 MG tablet Take 10 mg by mouth 3 (three) times daily.     hydrOXYzine (ATARAX) 25 MG tablet Take 25 mg by mouth 4 (four) times daily.      lisinopril-hydrochlorothiazide (ZESTORETIC) 20-25 MG tablet Take 1 tablet by mouth daily.  lovastatin (MEVACOR) 40 MG tablet Take 40 mg by mouth daily.     pimecrolimus (ELIDEL) 1 % cream Apply 1 Application topically 2 (two) times daily.     traMADol (ULTRAM) 50 MG tablet Take 50 mg by mouth 2 (two) times daily as needed.     traZODone (DESYREL) 150 MG tablet Take 150 mg by mouth at bedtime.     VENTOLIN HFA 108 (90 Base) MCG/ACT inhaler Inhale into the lungs as needed.     cephALEXin (KEFLEX) 500 MG capsule Take 1 capsule (500 mg total) by mouth 3 (three) times daily. (Patient not taking: Reported on 03/21/2022) 21 capsule 0   mupirocin ointment (BACTROBAN) 2 % Apply to affected area 2 times daily (Patient not taking: Reported on 03/21/2022) 22 g 0   No current facility-administered medications on file prior to visit.    There are no Patient Instructions on file for this visit. No follow-ups on file.   Georgiana Spinner, NP

## 2024-01-15 LAB — VAS US ABI WITH/WO TBI
Left ABI: 1.07
Right ABI: 1.11

## 2024-07-31 ENCOUNTER — Ambulatory Visit
Admission: RE | Admit: 2024-07-31 | Discharge: 2024-07-31 | Disposition: A | Discharge status: Home / Self Care | Payer: Self-pay | Source: Ambulatory Visit | Attending: Family Medicine | Admitting: Family Medicine

## 2024-07-31 DIAGNOSIS — Z780 Asymptomatic menopausal state: Secondary | ICD-10-CM | POA: Insufficient documentation

## 2024-07-31 DIAGNOSIS — Z1231 Encounter for screening mammogram for malignant neoplasm of breast: Secondary | ICD-10-CM | POA: Diagnosis present
